# Patient Record
Sex: Female | Born: 1970 | Hispanic: Yes | Marital: Married | Smoking: Never smoker
Health system: Southern US, Community
[De-identification: ages and names within clinical notes are randomized; demographics above are authoritative.]

## PROBLEM LIST (undated history)

## (undated) DIAGNOSIS — E119 Type 2 diabetes mellitus without complications: Secondary | ICD-10-CM

## (undated) DIAGNOSIS — E785 Hyperlipidemia, unspecified: Secondary | ICD-10-CM

## (undated) DIAGNOSIS — N189 Chronic kidney disease, unspecified: Secondary | ICD-10-CM

## (undated) DIAGNOSIS — I10 Essential (primary) hypertension: Secondary | ICD-10-CM

## (undated) HISTORY — DX: Hyperlipidemia, unspecified: E78.5

## (undated) HISTORY — PX: EYE SURGERY: SHX253

## (undated) HISTORY — DX: Essential (primary) hypertension: I10

## (undated) HISTORY — DX: Chronic kidney disease, unspecified: N18.9

---

## 1993-07-15 HISTORY — PX: BREAST EXCISIONAL BIOPSY: SUR124

## 2004-09-05 ENCOUNTER — Emergency Department: Payer: Self-pay | Admitting: Emergency Medicine

## 2005-08-04 ENCOUNTER — Emergency Department: Payer: Self-pay | Admitting: Emergency Medicine

## 2006-01-23 ENCOUNTER — Emergency Department: Payer: Self-pay | Admitting: Unknown Physician Specialty

## 2007-06-12 ENCOUNTER — Inpatient Hospital Stay: Payer: Self-pay | Admitting: Internal Medicine

## 2007-06-19 ENCOUNTER — Ambulatory Visit: Payer: Self-pay | Admitting: General Surgery

## 2007-10-02 ENCOUNTER — Emergency Department: Payer: Self-pay | Admitting: Emergency Medicine

## 2008-06-08 ENCOUNTER — Emergency Department: Payer: Self-pay | Admitting: Internal Medicine

## 2010-10-29 ENCOUNTER — Emergency Department: Payer: Self-pay | Admitting: Internal Medicine

## 2016-05-19 ENCOUNTER — Emergency Department
Admission: EM | Admit: 2016-05-19 | Discharge: 2016-05-19 | Disposition: A | Discharge status: Home / Self Care | Payer: Self-pay | Attending: Emergency Medicine | Admitting: Emergency Medicine

## 2016-05-19 DIAGNOSIS — E119 Type 2 diabetes mellitus without complications: Secondary | ICD-10-CM | POA: Insufficient documentation

## 2016-05-19 DIAGNOSIS — Z789 Other specified health status: Secondary | ICD-10-CM

## 2016-05-19 DIAGNOSIS — Z3046 Encounter for surveillance of implantable subdermal contraceptive: Secondary | ICD-10-CM | POA: Insufficient documentation

## 2016-05-19 HISTORY — DX: Type 2 diabetes mellitus without complications: E11.9

## 2016-05-19 NOTE — ED Notes (Signed)
Reviewed d/c instructions, follow-up care with patient. Pt verbalized understanding.  

## 2016-05-19 NOTE — ED Triage Notes (Signed)
Pt c/o that her birth control implant in her left upper arm is no longer fixed in place

## 2016-05-19 NOTE — Discharge Instructions (Signed)
[facility] of implant placement/removal.

## 2016-05-19 NOTE — ED Provider Notes (Signed)
[facility] Emergency Department Provider Note   ____________________________________________   First MD Initiated Contact with Patient 05/19/16 2003     (approximate)  I have reviewed the triage vital signs and the nursing notes.   HISTORY  Chief Complaint Other [facility](BC implant issues)  Via interpreter  HPI Brandy Clements is a 45 y.o. female patient requests removal of birth control implant from left upper arm. Patient said implants abating 4 year. Patient states today she felt implants are mobile. Patient denies any pain with this complaint. Patient state procedure was performed by social service.   Past Medical History:  Diagnosis Date  . Diabetes mellitus without complication (HCC)     There are no active problems to display for this patient.   Past Surgical History:  Procedure Laterality Date  . CESAREAN SECTION      Prior to Admission medications   Not on File    Allergies Patient has no known allergies.  History reviewed. No pertinent family history.  Social History Social History  Substance Use Topics  . Smoking status: Never Smoker  . Smokeless tobacco: Never Used  . Alcohol use No    Review of Systems Constitutional: No fever/chills Eyes: No visual changes. ENT: No sore throat. Cardiovascular: Denies chest pain. Respiratory: Denies shortness of breath. Gastrointestinal: No abdominal pain.  No nausea, no vomiting.  No diarrhea.  No constipation. Genitourinary: Negative for dysuria. Musculoskeletal: Negative for back pain. Skin: Negative for rash. Neurological: Negative for headaches, focal weakness or numbness. Endocrine:Diabetes   ____________________________________________   PHYSICAL EXAM:  VITAL SIGNS: ED Triage Vitals  Enc Vitals Group     BP 05/19/16 1952 (!) 167/85     Pulse Rate 05/19/16 1952 (!) 102     Resp 05/19/16 1952 18     Temp 05/19/16 1952 98.8 F (37.1 C)     Temp Source  05/19/16 1952 Oral     SpO2 05/19/16 1952 99 %     Weight 05/19/16 1953 160 lb (72.6 kg)     Height 05/19/16 1953 5\' 1"  (1.549 m)     Head Circumference --      Peak Flow --      Pain Score --      Pain Loc --      Pain Edu? --      Excl. in GC? --     Constitutional: Alert and oriented. Well appearing and in no acute distress. Eyes: Conjunctivae are normal. PERRL. EOMI. Head: Atraumatic. Nose: No congestion/rhinnorhea. Mouth/Throat: Mucous membranes are moist.  Oropharynx non-erythematous. Neck: No stridor.  No cervical spine tenderness to palpation. Hematological/Lymphatic/Immunilogical: No cervical lymphadenopathy. Cardiovascular: Normal rate, regular rhythm. Grossly normal heart sounds.  Good peripheral circulation.Elevated blood pressure Respiratory: Normal respiratory effort.  No retractions. Lungs CTAB. Gastrointestinal: Soft and nontender. No distention. No abdominal bruits. No CVA tenderness. Musculoskeletal: No lower extremity tenderness nor edema.  No joint effusions. Neurologic:  Normal speech and language. No gross focal neurologic deficits are appreciated. No gait instability. Skin:  Skin is warm, dry and intact. No rash noted. Palpable implant left upper arm. Psychiatric: Mood and affect are normal. Speech and behavior are normal.  ____________________________________________   LABS (all labs ordered are listed, but only abnormal results are displayed)  Labs Reviewed - No data to display ____________________________________________  EKG   ____________________________________________  RADIOLOGY   ____________________________________________   PROCEDURES  Procedure(s) performed: None  Procedures  Critical Care performed: No  ____________________________________________   INITIAL  IMPRESSION / ASSESSMENT AND PLAN / ED COURSE  Pertinent labs & imaging results that were available during my care of the patient were reviewed by me and considered in my  medical decision making (see chart for details).  Request birth control implant removal from left arm. Discussed with patient no emergent concern for removal. Advised to follow-up with [facility] for further evaluation/removal.  Clinical Course      ____________________________________________   FINAL CLINICAL IMPRESSION(S) / ED DIAGNOSES  Final diagnoses:  Uses contraceptive implant for birth control      NEW MEDICATIONS STARTED DURING THIS VISIT:  New Prescriptions   No medications on file     Note:  This document was prepared using Dragon voice recognition software and may include unintentional dictation errors.    Joni ReiningRonald K Aeon Kessner, PA-C 05/19/16 2027    Myrna Blazeravid Matthew Schaevitz, MD 05/19/16 [phone]

## 2019-04-13 ENCOUNTER — Other Ambulatory Visit: Payer: Self-pay

## 2019-04-13 ENCOUNTER — Telehealth: Payer: Self-pay

## 2019-04-13 NOTE — Telephone Encounter (Signed)
Attempted pre-visit screening prior to [facility] appointment on 04/14/2019 with [place] [phone]. No answer / no voicemail left.

## 2019-04-14 ENCOUNTER — Ambulatory Visit
Admission: RE | Admit: 2019-04-14 | Discharge: 2019-04-14 | Disposition: A | Discharge status: Home / Self Care | Payer: Self-pay | Source: Ambulatory Visit | Attending: Oncology | Admitting: Oncology

## 2019-04-14 ENCOUNTER — Encounter: Payer: Self-pay | Admitting: *Deleted

## 2019-04-14 ENCOUNTER — Other Ambulatory Visit: Payer: Self-pay

## 2019-04-14 ENCOUNTER — Ambulatory Visit: Payer: Self-pay | Attending: Oncology | Admitting: *Deleted

## 2019-04-14 VITALS — BP 158/85 | HR 96 | Temp 97.4°F | Ht 59.0 in | Wt 130.0 lb

## 2019-04-14 DIAGNOSIS — N63 Unspecified lump in unspecified breast: Secondary | ICD-10-CM | POA: Insufficient documentation

## 2019-04-14 NOTE — Patient Instructions (Signed)
Gave patient hand-out, Women Staying Healthy, Active and Well from [facility], with education on breast health, pap smears, heart and colon health. 

## 2019-04-14 NOTE — Progress Notes (Signed)
  Subjective:     Patient ID: Brandy Clements, female   DOB: 1971/06/23, 48 y.o.   MRN: [id number]  HPI   Review of Systems     Objective:   Physical Exam Chest:     Breasts:        Right: Mass present. No swelling, bleeding, inverted nipple, nipple discharge or skin change.        Left: No swelling, bleeding, inverted nipple, mass, nipple discharge, skin change or tenderness.    Lymphadenopathy:     Upper Body:     Right upper body: No supraclavicular or axillary adenopathy.     Left upper body: No supraclavicular or axillary adenopathy.        Assessment:     48 year old Hispanic female referred to [place] by the [facility] Department for further evaluation of a right breast mass.  Brandy Clements [id number], the interpreter from [employer] is used during the interview and exam.  Patient states she had a cyst removed years ago in the right breast while in [place].  States she has not noticed any changes.  On clinical breast exam there is a scar at the 11-12:00 area of the right breast.  Bilateral breast have a fibroglandular like pattern at the 10-12:00 area.  I can however palpate a tiny approximate 0.5 cm firm mobile nodule at 11:00 right breast at the edge of the areola.  Taught self breast awareness.  Patient has been screened for eligibility.  She does not have any insurance, Medicare or Medicaid.  She also meets financial eligibility.  Hand-out given on the Affordable Care Act. Risk Assessment    Risk Scores      04/14/2019   Last edited by: Theodore Demark, RN   5-year risk: 0.4 %   Lifetime risk: 4.4 %            Plan:     Will get bilateral diagnostic mammogram and ultrasound.  Will follow up per [facility] protocol.

## 2019-04-15 ENCOUNTER — Encounter: Payer: Self-pay | Admitting: *Deleted

## 2019-04-15 NOTE — Progress Notes (Signed)
Patient with bilateral cysts noted on mammogram and ultrasound.  Letter mailed from the [facility] to inform patient of her normal mammogram results.  Patient is to follow-up with annual screening in one year.  HSIS to [place].

## 2021-06-13 IMAGING — MG MM DIGITAL DIAGNOSTIC BILAT W/ TOMO W/ CAD
8 of 16 series · 8 of 40 positions shown · non-contrast
Comparison: None

CLINICAL DATA: Palpable area of concern in the retroareolar right
breast at the 11 o'clock position felt by the patient's clinician.

EXAM:
DIGITAL DIAGNOSTIC BILATERAL MAMMOGRAM WITH CAD AND TOMO
ULTRASOUND BILATERAL BREAST

[R ML synth-2D]
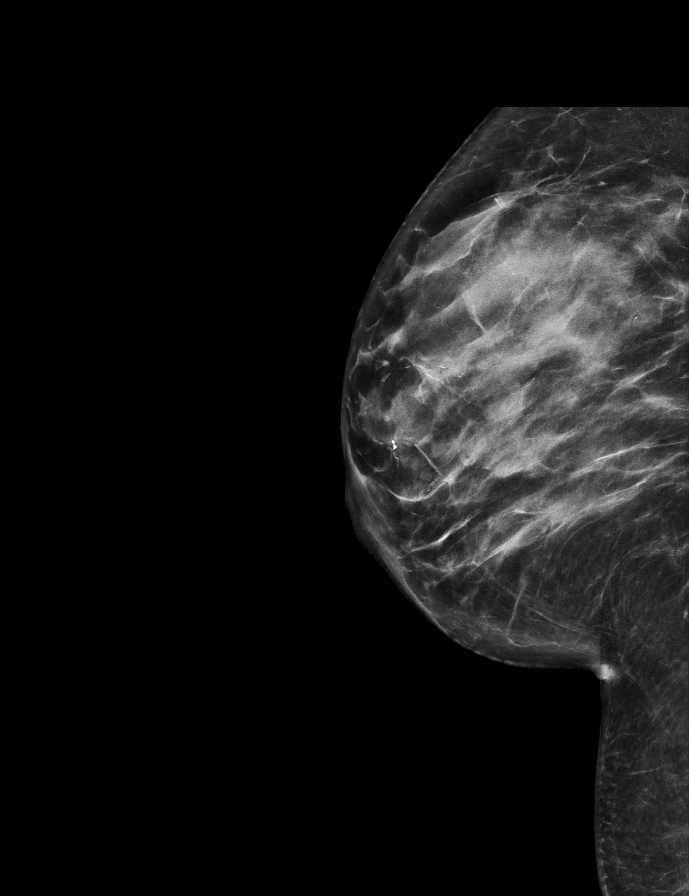

[L MLO synth-2D]
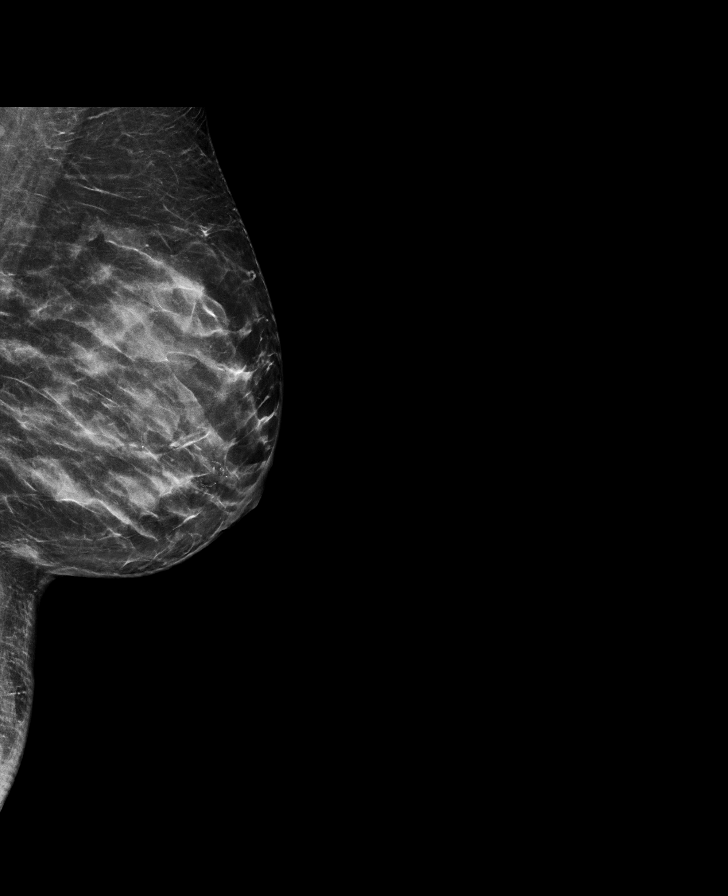

[L CC synth-2D (1 of 2)]
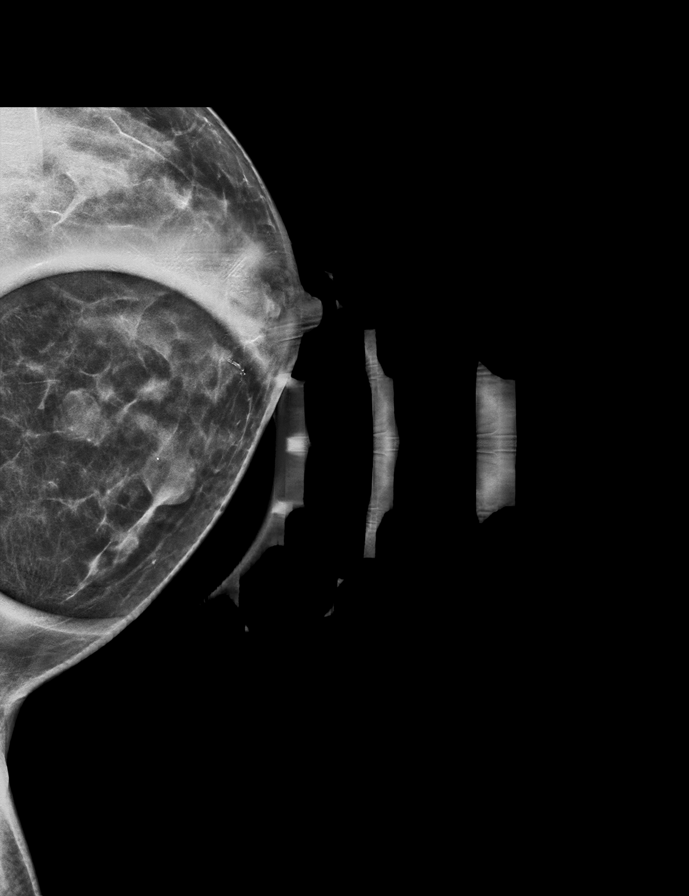

[L ML synth-2D]
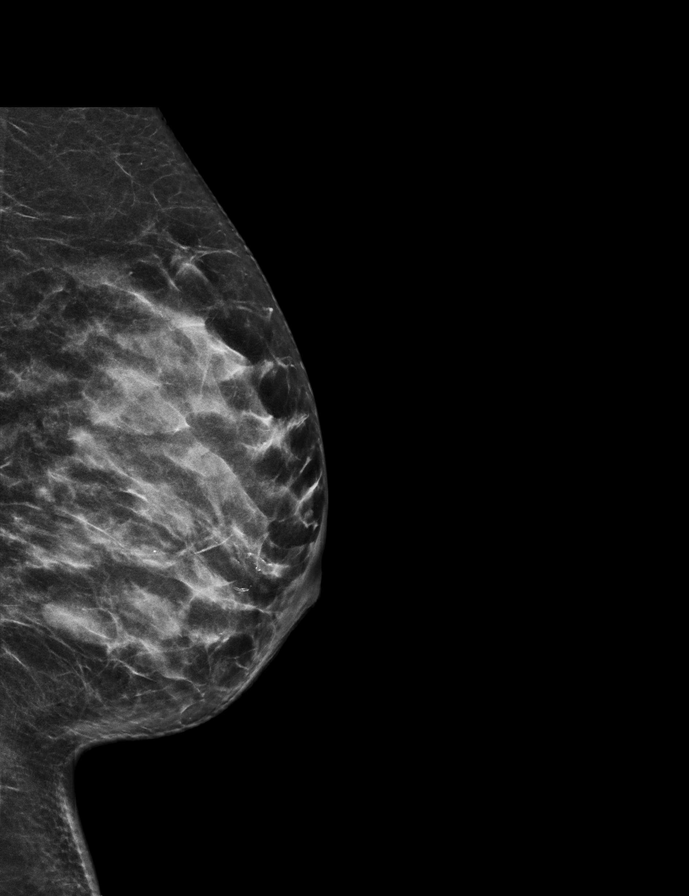

[L CC synth-2D (2 of 2)]
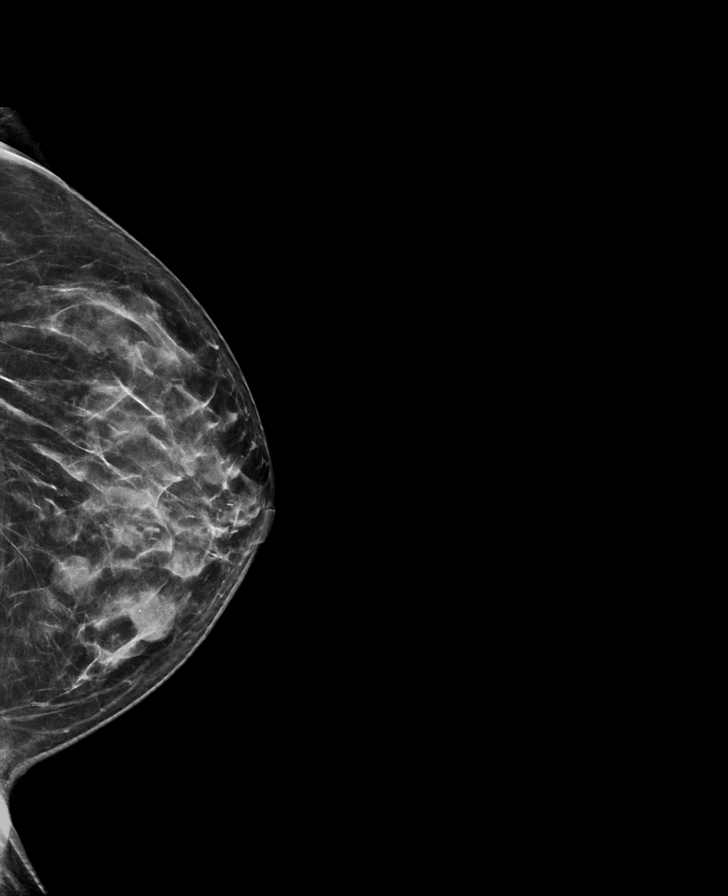

[R CC synth-2D]
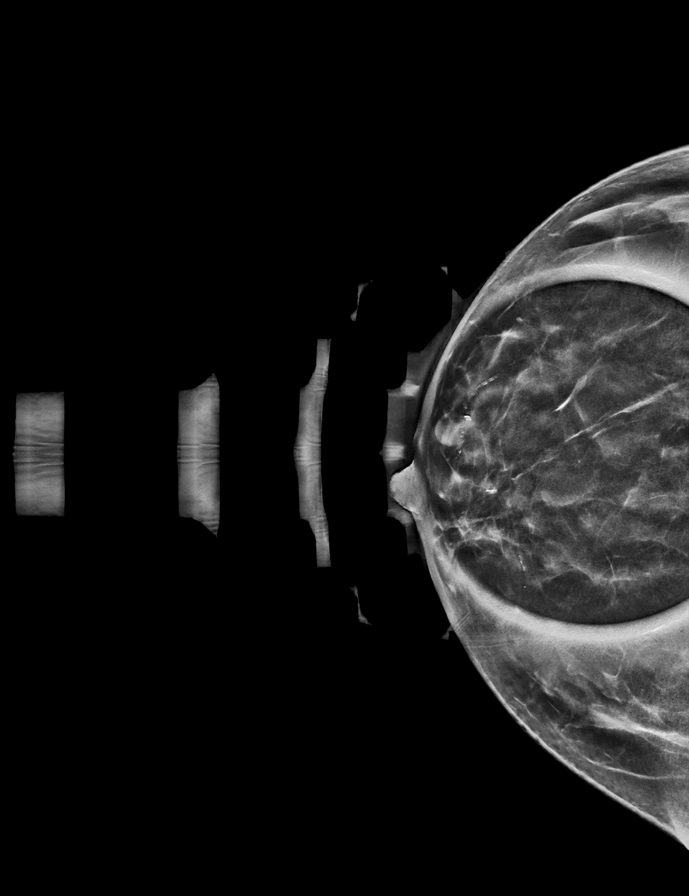

[R MLO synth-2D]
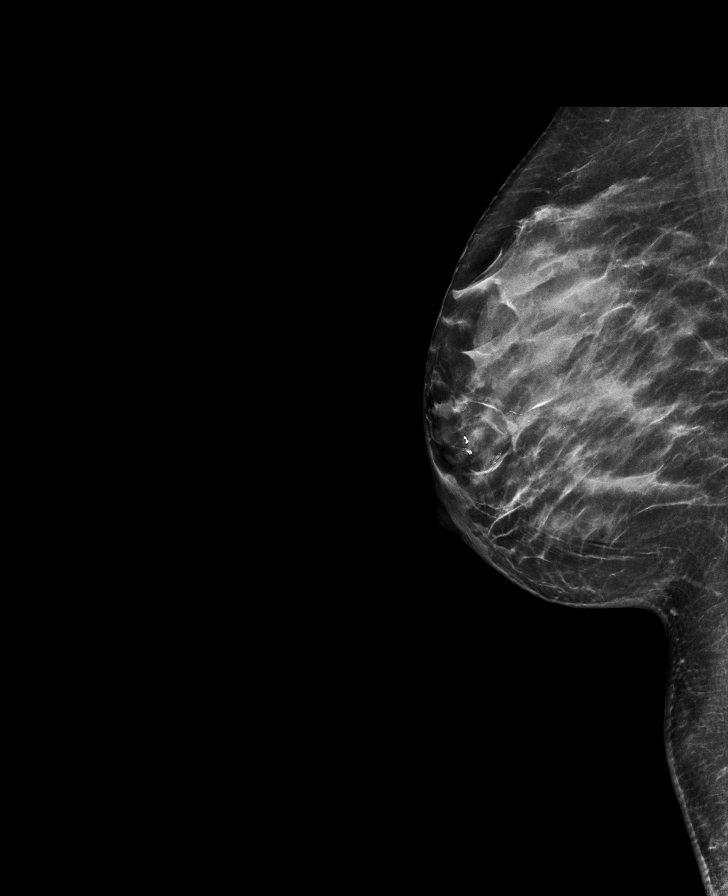

[L CC tomo · tomo slice 27/54.0]
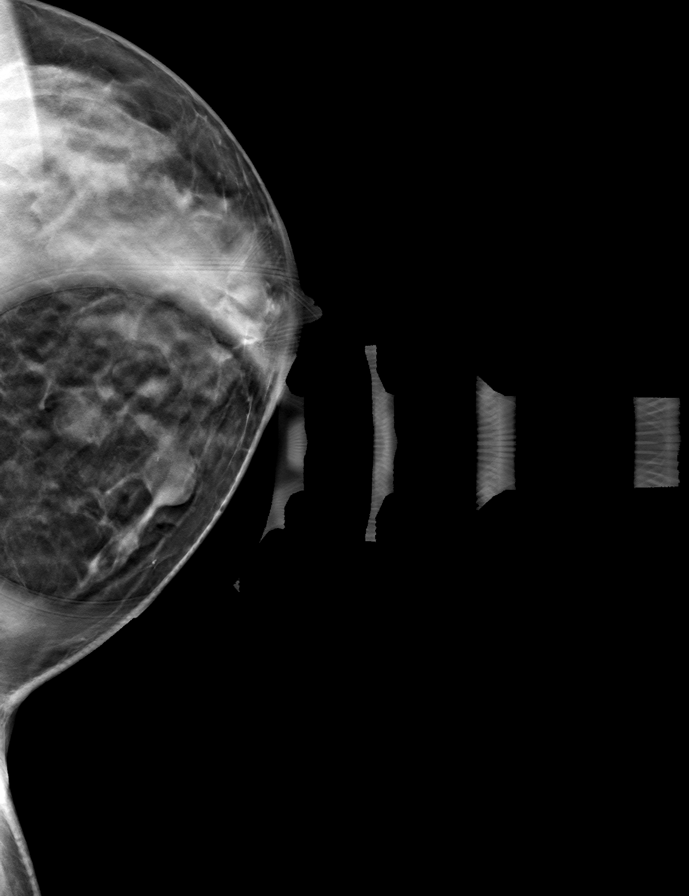

[8 of 40 positions shown; findings below may reference images not displayed]

ACR Breast Density Category c: The breast tissue is heterogeneously
dense, which may obscure small masses.
FINDINGS: Multiple bilateral circumscribed and obscured masses are identified
in both breasts. Mammographic images were processed with CAD.

Targeted bilateral ultrasound was performed.

Multiple circumscribed hypoechoic masses are seen in both breasts:

Left breast 8:30 o'clock 2 cm from the nipple measuring 1.4 x 0.7 x
1.3 cm

Left breast 8:30 o'clock 2 cm from the nipple measuring 0.9 x 0.8 x
1.4 cm

Left breast 9:30 o'clock 4 cm from the nipple measuring 1.5 x 0.9 x
1.3 cm

Left breast 10 o'clock 5 cm from the nipple measuring 1.4 x 0.6 x
1.5 cm

Left breast 11:30 o'clock 2 cm from the nipple measuring 1.5 x 0.6 x
1.1 cm

Left breast retroareolar 12 o'clock measuring 1.3 x 0.7 x 1.3 cm

Right breast 11 o'clock 1 cm from the nipple measuring 1.3 x 0.8 x
1.5 cm

Right breast 11:30 o'clock 5 cm from the nipple measuring 1.4 x
x 1.8 cm

Sonographic correlates are identified for all the mammographic
masses. No suspicious right axillary lymph nodes are identified.
IMPRESSION: Multiple bilateral circumscribed masses are benign.

RECOMMENDATION:
Recommend follow-up bilateral mammogram and ultrasound in 1 year to
document stability.

I have discussed the findings and recommendations with the patient.
If applicable, a reminder letter will be sent to the patient
regarding the next appointment.

BI-RADS CATEGORY  2: Benign.

## 2021-09-20 LAB — HM PAP SMEAR: HM Pap smear: NEGATIVE

## 2022-03-11 ENCOUNTER — Encounter: Payer: Self-pay | Admitting: Nurse Practitioner

## 2022-03-11 ENCOUNTER — Ambulatory Visit (LOCAL_COMMUNITY_HEALTH_CENTER): Payer: Self-pay | Admitting: Nurse Practitioner

## 2022-03-11 ENCOUNTER — Ambulatory Visit: Payer: Self-pay

## 2022-03-11 VITALS — BP 159/85 | Wt 129.0 lb

## 2022-03-11 DIAGNOSIS — Z3009 Encounter for other general counseling and advice on contraception: Secondary | ICD-10-CM

## 2022-03-11 DIAGNOSIS — Z3046 Encounter for surveillance of implantable subdermal contraceptive: Secondary | ICD-10-CM

## 2022-03-11 DIAGNOSIS — Z0000 Encounter for general adult medical examination without abnormal findings: Secondary | ICD-10-CM

## 2022-03-12 ENCOUNTER — Encounter: Payer: Self-pay | Admitting: Nurse Practitioner

## 2022-03-12 NOTE — Progress Notes (Signed)
Nexplanon Removal ?Patient identified, informed consent performed, consent signed.   Appropriate time out taken. Nexplanon site identified.  Area prepped in usual sterile fashon. 3 ml of 1% lidocaine with Epinephrine was used to anesthetize the area at the distal end of the implant and along implant site. A small stab incision was made right beside the implant on the distal portion.  The Nexplanon rod was grasped using hemostats/manual and removed without difficulty.  There was minimal blood loss. There were no complications.  Steri-strips were applied over the small incision.  A pressure bandage was applied to reduce any bruising.  The patient tolerated the procedure well and was given post procedure instructions.   Montie Gelardi, FNP  ?

## 2022-03-12 NOTE — Progress Notes (Signed)
Abstracted documents. Glenna Fellows, FNP

## 2022-03-12 NOTE — Progress Notes (Signed)
[facility] DEPARTMENT [facility] [address] Main Number: [phone]    Family Planning Visit- Initial Visit  Subjective:  Brandy Clements is a 51 y.o.  [id number]   being seen today for an initial annual visit and to discuss reproductive life planning.  The patient is currently using Hormonal Implant for pregnancy prevention. Patient reports   does not want a pregnancy in the next year.     report they are looking for a method that provides High efficacy at preventing pregnancy  Patient has the following medical conditions does not have a problem list on file.  Chief Complaint  Patient presents with   Contraception    Nexplanon removal     Patient reports to clinic today for a physical and Nexplanon removal.   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     Body mass index is 26.05 kg/m. - Patient is eligible for diabetes screening based on BMI and age >85?  yes HA1C ordered? No, recently done, see results in [facility]   Patient reports 1  partner/s in last year. Desires STI screening?  No - refused  Has patient been screened once for HCV in the past?  Yes  No results found for: "HCVAB"  Does the patient have current drug use (including MJ), have a partner with drug use, and/or has been incarcerated since last result? No  If yes-- Screen for HCV through [facility]   Does the patient meet criteria for HBV testing? No  Criteria:  -Household, sexual or needle sharing contact with HBV -History of drug use -HIV positive -Those with known Hep C   Health Maintenance Due  Topic Date Due   COVID-19 Vaccine (1) Never done   URINE MICROALBUMIN  Never done   HIV Screening  Never done   Hepatitis C Screening  Never done   COLONOSCOPY (Pts 45-77yrs Insurance coverage will need to be confirmed)  Never done   PAP  SMEAR-Modifier  09/20/2021   MAMMOGRAM  01/31/2022   Zoster Vaccines- Shingrix (1 of 2) Never done   INFLUENZA VACCINE  02/12/2022    Review of Systems  Constitutional:  Positive for weight loss. Negative for chills, fever and malaise/fatigue.  HENT:  Negative for congestion, hearing loss and sore throat.        Yellowing of the eyes  Eyes:  Negative for blurred vision, double vision and photophobia.  Respiratory:  Negative for shortness of breath.   Cardiovascular:  Negative for chest pain.  Gastrointestinal:  Positive for nausea. Negative for abdominal pain, blood in stool, constipation, diarrhea, heartburn and vomiting.  Genitourinary:  Negative for dysuria and frequency.  Musculoskeletal:  Negative for back pain, joint pain and neck pain.       Swelling   Skin:  Negative for itching and rash.  Neurological:  Negative for dizziness, weakness and headaches.  Endo/Heme/Allergies:  Does not bruise/bleed easily.  Psychiatric/Behavioral:  Negative for depression, substance abuse and suicidal ideas.     The following portions of the patient's history were reviewed and updated as appropriate: allergies, current medications, past family history, past medical history, past social history, past surgical history and problem list. Problem list updated.   See flowsheet for other program required questions.  Objective:   Vitals:   03/11/22 1423  BP: (!) 159/85  Weight: 129 lb (  58.5 kg)    Physical Exam Constitutional:      Appearance: Normal appearance.  HENT:     Head: Normocephalic. No abrasion, masses or laceration. Hair is normal.     Jaw: No tenderness or swelling.     Right Ear: External ear normal.     Left Ear: External ear normal.     Nose: Nose normal.     Mouth/Throat:     Lips: Pink. No lesions.     Mouth: Mucous membranes are moist. No lacerations or oral lesions.     Dentition: No dental caries.     Tongue: No lesions.     Palate: No mass and lesions.      Pharynx: No pharyngeal swelling, oropharyngeal exudate, posterior oropharyngeal erythema or uvula swelling.     Tonsils: No tonsillar exudate or tonsillar abscesses.     Comments: No visible signs of dental caries  Eyes:     Pupils: Pupils are equal, round, and reactive to light.  Neck:     Thyroid: No thyroid mass, thyromegaly or thyroid tenderness.  Cardiovascular:     Rate and Rhythm: Normal rate and regular rhythm.  Pulmonary:     Effort: Pulmonary effort is normal.     Breath sounds: Normal breath sounds.  Chest:  Breasts:    Right: Normal. No swelling, mass, nipple discharge, skin change or tenderness.     Left: Normal. No swelling, mass, nipple discharge, skin change or tenderness.     Comments: Port noted right side of chest.   Abdominal:     General: Abdomen is flat. Bowel sounds are normal.     Palpations: Abdomen is soft.     Tenderness: There is no abdominal tenderness. There is no rebound.  Genitourinary:    Comments: Deferred, declined genital exam  Musculoskeletal:     Cervical back: Full passive range of motion without pain and normal range of motion.  Lymphadenopathy:     Cervical: No cervical adenopathy.     Right cervical: No superficial, deep or posterior cervical adenopathy.    Left cervical: No superficial, deep or posterior cervical adenopathy.     Upper Body:     Right upper body: No supraclavicular, axillary or epitrochlear adenopathy.     Left upper body: No supraclavicular, axillary or epitrochlear adenopathy.  Skin:    General: Skin is warm and dry.     Findings: No erythema, laceration, lesion or rash.  Neurological:     Mental Status: She is alert and oriented to person, place, and time.  Psychiatric:        Attention and Perception: Attention normal.        Mood and Affect: Mood normal.        Speech: Speech normal.        Behavior: Behavior normal. Behavior is cooperative.       Assessment and Plan:  Brandy Clements is a 51  y.o. female presenting to the [facility] Department for an initial annual wellness/contraceptive visit  Contraception counseling: Reviewed options based on patient desire and reproductive life plan. Patient is interested in No Method - Other Reason.   Risks, benefits, and typical effectiveness rates were reviewed.  Questions were answered.  Written information was also given to the patient to review.    The patient will follow up in  1 years for surveillance.  The patient was told to call with any further questions, or with any concerns about this method of contraception.  Emphasized use of condoms 100% of the time for STI prevention.  Need for ECP was assessed. ECP not offered due to current use of a birth control method.   1. Family planning counseling -51 year old female in clinic today for a physical and desires to have her Nexplanon removed. -ROS reviewed.  Patient has an extensive medical history that consist of diabetes, kidney failure, high cholesterol, hypertension, and retinal detachment.  Complaints of yellowing of the eyes, weight loss, swelling, and nausea are all symptoms that she has been experiencing since she started her dialysis treatment.  Advised to follow up with PCP regarding signs and symptoms.   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 Patient would like the Nexplanon to be placed in the other arm.  Reviewed with patient the safest forms of birth control.  Due to medical history recommended an IUD.  Patient not interested in an IUD as a form of birth control.  Advised patient to discuss other options with PCP and to discuss potentially being premenopausal.    -STD screening offered, patient declined.     2. Well woman exam (no gynecological exam) -Normal well woman exam. -PAP 09/2023 -CBE today, next due 02/2023  3. Nexplanon removal -See  Nexplanon removal note.    Return in about 1 year (around 03/12/2023) for Annual well-woman exam.  Total time spent: 45 minutes   Glenna Fellows, FNP

## 2024-02-12 ENCOUNTER — Other Ambulatory Visit: Payer: Self-pay

## 2024-02-12 DIAGNOSIS — N6310 Unspecified lump in the right breast, unspecified quadrant: Secondary | ICD-10-CM

## 2024-02-12 DIAGNOSIS — N6320 Unspecified lump in the left breast, unspecified quadrant: Secondary | ICD-10-CM

## 2024-03-22 ENCOUNTER — Ambulatory Visit: Payer: Self-pay | Attending: Obstetrics and Gynecology | Admitting: *Deleted

## 2024-03-22 ENCOUNTER — Ambulatory Visit
Admission: RE | Admit: 2024-03-22 | Discharge: 2024-03-22 | Disposition: A | Discharge status: Home / Self Care | Payer: Self-pay | Source: Ambulatory Visit | Attending: Obstetrics and Gynecology | Admitting: Obstetrics and Gynecology

## 2024-03-22 VITALS — BP 151/68 | Wt 151.6 lb

## 2024-03-22 DIAGNOSIS — N6310 Unspecified lump in the right breast, unspecified quadrant: Secondary | ICD-10-CM | POA: Insufficient documentation

## 2024-03-22 DIAGNOSIS — N6320 Unspecified lump in the left breast, unspecified quadrant: Secondary | ICD-10-CM

## 2024-03-22 DIAGNOSIS — Z1239 Encounter for other screening for malignant neoplasm of breast: Secondary | ICD-10-CM

## 2024-03-22 NOTE — Progress Notes (Signed)
 Ms. Brandy Clements is a 53 y.o. female who presents to [facility] clinic today with no complaints. Patient referred to [facility] due to she had a diagnostic mammogram completed 04/14/2019 that a one year follow up diagnostic mammogram was recommended and not completed.   Pap Smear: Pap smear not completed today. Last Pap smear was 09/20/2021 at the [facility] Department clinic and was normal with negative HPV. Per patient has no history of an abnormal Pap smear. Last Pap smear result is available in Epic.   Physical exam: Breasts Right breast is larger than left breast that per patient has not noticed any changes. No skin abnormalities bilateral breasts. No nipple retraction bilateral breasts. No nipple discharge bilateral breasts. No lymphadenopathy. No lumps palpated bilateral breasts. No complaints of pain or tenderness on exam.     MS DIGITAL DIAG TOMO BILAT Result Date: 04/14/2019 CLINICAL DATA:  Palpable area of concern in the retroareolar right breast at the 11 o'clock position felt by the patient's clinician. Patient does not feel any area of concern. EXAM: DIGITAL DIAGNOSTIC BILATERAL MAMMOGRAM WITH CAD AND TOMO ULTRASOUND BILATERAL BREAST COMPARISON:  None ACR Breast Density Category c: The breast tissue is heterogeneously dense, which may obscure small masses. FINDINGS: Multiple bilateral circumscribed and obscured masses are identified in both breasts. Mammographic images were processed with CAD. Targeted bilateral ultrasound was performed. Multiple circumscribed hypoechoic masses are seen in both breasts: Left breast 8:30 o'clock 2 cm from the nipple measuring 1.4 x 0.7 x 1.3 cm Left breast 8:30 o'clock 2 cm from the nipple measuring 0.9 x 0.8 x 1.4 cm Left breast 9:30 o'clock 4 cm from the nipple measuring 1.5 x 0.9 x 1.3 cm Left breast 10 o'clock 5 cm from the nipple measuring 1.4 x 0.6 x 1.5 cm Left breast 11:30 o'clock 2 cm from the nipple measuring 1.5 x 0.6 x 1.1 cm Left  breast retroareolar 12 o'clock measuring 1.3 x 0.7 x 1.3 cm Right breast 11 o'clock 1 cm from the nipple measuring 1.3 x 0.8 x 1.5 cm Right breast 11:30 o'clock 5 cm from the nipple measuring 1.4 x 0.7 x 1.8 cm Sonographic correlates are identified for all the mammographic masses. No suspicious right axillary lymph nodes are identified. IMPRESSION: Multiple bilateral circumscribed masses are benign. RECOMMENDATION: Recommend follow-up bilateral mammogram and ultrasound in 1 year to document stability. I have discussed the findings and recommendations with the patient. If applicable, a reminder letter will be sent to the patient regarding the next appointment. BI-RADS CATEGORY  2: Benign. Electronically Signed   By: Norman Hopper M.D.   On: 04/14/2019 15:56   Pelvic/Bimanual Pap is not indicated today per B[facility]CP guidelines.   Smoking History: Patient has never smoked.   Patient Navigation: Patient education provided. Access to services provided for patient through [employer] program. Spanish interpreter Damon Pierce from [facility] provided.  Colorectal Cancer Screening: Per patient has never had colonoscopy completed. Patient stated her PCP at [facility] gave her a Cologuard test and she has not completed. Explained the importance of completing test. No complaints today.    Breast and Cervical Cancer Risk Assessment: Patient does not have family history of breast cancer, known genetic mutations, or radiation treatment to the chest before age 35. Patient does not have history of cervical dysplasia, immunocompromised, or DES exposure in-utero.  Risk Assessment   No risk assessment data for the current encounter  Risk Scores       04/14/2019   Last  edited by: Dannielle Arlean FALCON, RN   5-year risk: 0.4%   Lifetime risk: 4.4%            A: [facility]CP exam without pap smear No complaints.  P: Referred patient to the [facility] for a diagnostic mammogram per recommendation.  Appointment scheduled Monday, March 22, 2024 at 1020.  Driscilla Wanda SQUIBB, RN 03/22/2024 9:07 AM

## 2024-03-22 NOTE — Patient Instructions (Signed)
 Explained breast self awareness with Brandy Clements. Patient did not need a Pap smear today due to last Pap smear and HPV typing was 09/20/2021. Let her know [facility] will cover Pap smears and HPV typing every 5 years unless has a history of abnormal Pap smears. Referred patient to the [facility] for a diagnostic mammogram per recommendation. Appointment scheduled Monday, March 22, 2024 at 1020. Patient aware of appointment and will be there. Brandy Clements verbalized understanding.  [name], Wanda Ship, RN 9:07 AM
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
